# Patient Record
Sex: Female | Born: 1960 | Race: Black or African American | Hispanic: No | Marital: Married | Smoking: Current every day smoker
Health system: Southern US, Community
[De-identification: ages and names within clinical notes are randomized; demographics above are authoritative.]

## PROBLEM LIST (undated history)

## (undated) DIAGNOSIS — I10 Essential (primary) hypertension: Secondary | ICD-10-CM

## (undated) DIAGNOSIS — G629 Polyneuropathy, unspecified: Secondary | ICD-10-CM

## (undated) DIAGNOSIS — E119 Type 2 diabetes mellitus without complications: Secondary | ICD-10-CM

## (undated) DIAGNOSIS — Z803 Family history of malignant neoplasm of breast: Secondary | ICD-10-CM

## (undated) DIAGNOSIS — R7989 Other specified abnormal findings of blood chemistry: Secondary | ICD-10-CM

## (undated) HISTORY — DX: Family history of malignant neoplasm of breast: Z80.3

## (undated) HISTORY — DX: Type 2 diabetes mellitus without complications: E11.9

## (undated) HISTORY — DX: Other specified abnormal findings of blood chemistry: R79.89

## (undated) HISTORY — DX: Essential (primary) hypertension: I10

## (undated) HISTORY — DX: Polyneuropathy, unspecified: G62.9

---

## 2008-12-01 ENCOUNTER — Inpatient Hospital Stay: Payer: Self-pay | Admitting: Internal Medicine

## 2010-07-05 IMAGING — CT CT HEAD WITHOUT CONTRAST
2 series · 16 of 30 positions shown, 20 images · non-contrast
Comparison: none

REASON FOR EXAM: altered mental status
COMMENTS:

PROCEDURE:     CT  - CT HEAD WITHOUT CONTRAST  - November 30, 2008 [DATE]
RESULT:
HISTORY: Altered mental status.
COMPARISON STUDIES: No recent.
PROCEDURE AND FINDINGS: No intra-axial or extra-axial pathologic fluid or
blood collections identified.  No mass lesion is noted. There is no
hydrocephalus.

[Series 3: without · axial · non-contrast · 0.45mm/px · z∈[+759,+889]mm · 13 of 40 slices shown, 17 images]
[im 3/40  brain]
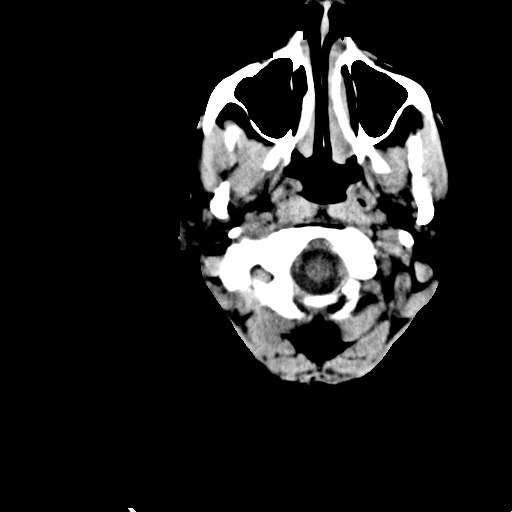
[im 3/40  bone]
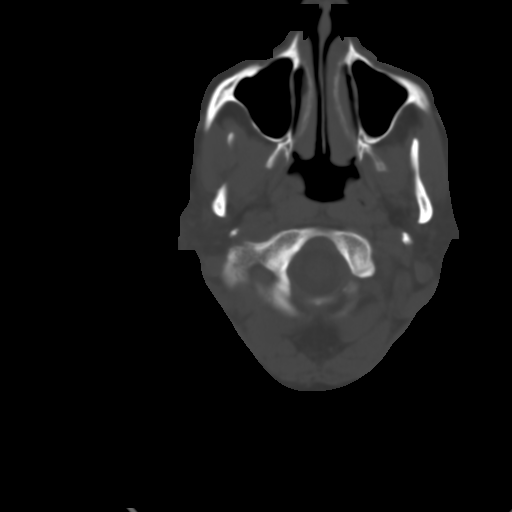
[im 6/40  brain]
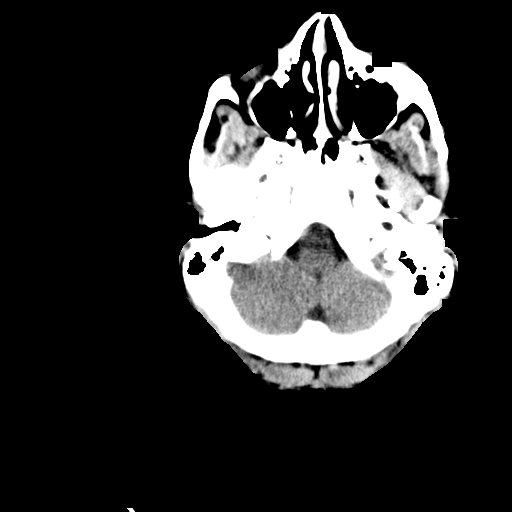
[im 9/40  brain]
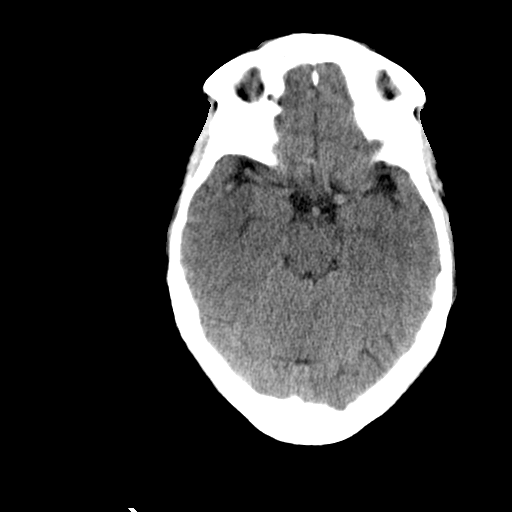
[im 12/40  brain]
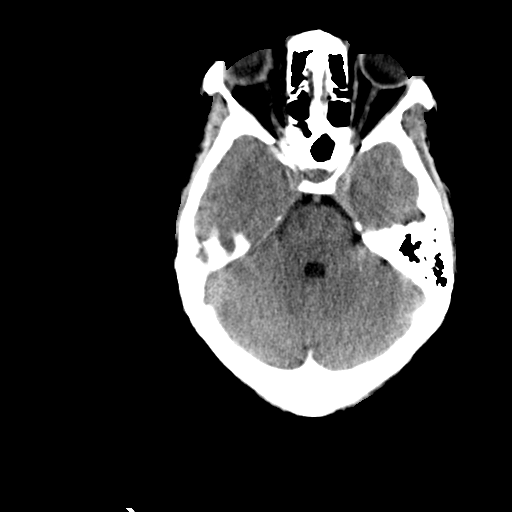
[im 14/40  brain]
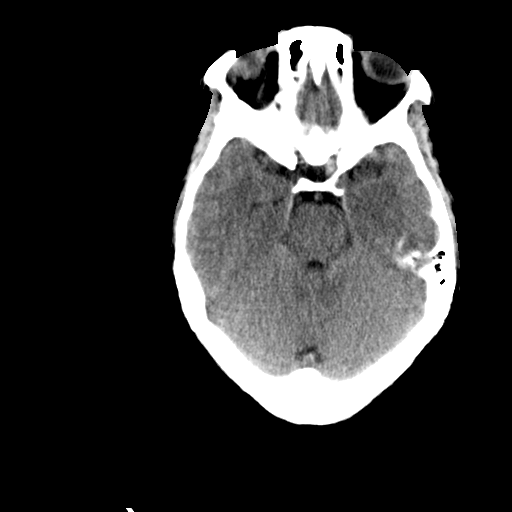
[im 14/40  bone]
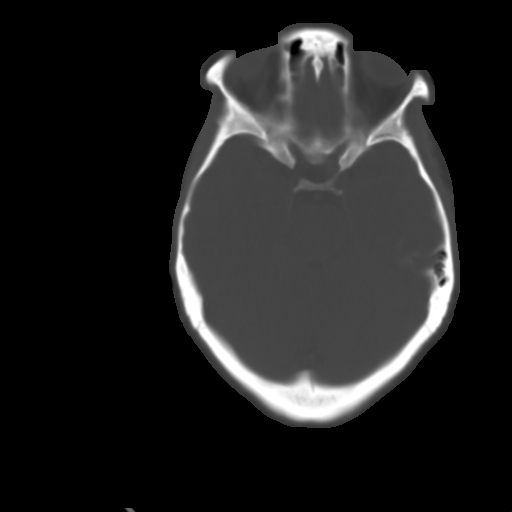
[im 17/40  brain]
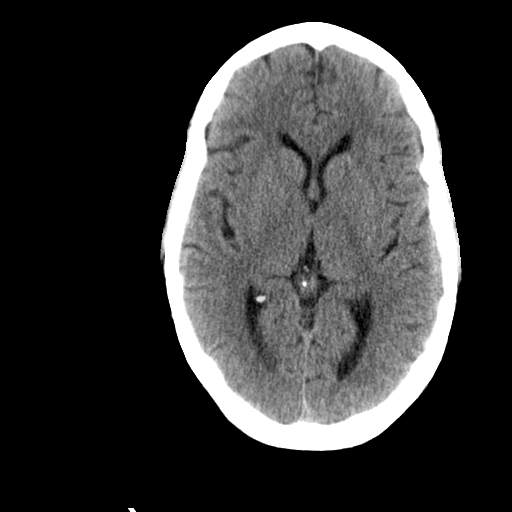
[im 20/40  brain]
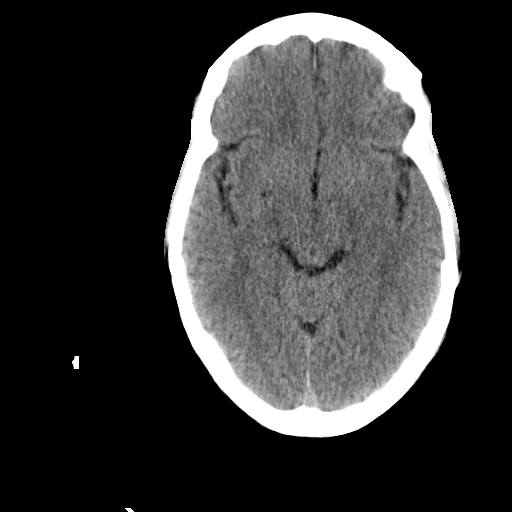
[im 23/40  brain]
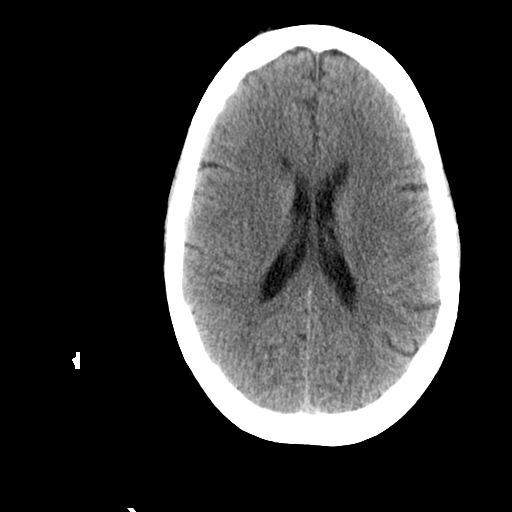
[im 26/40  brain]
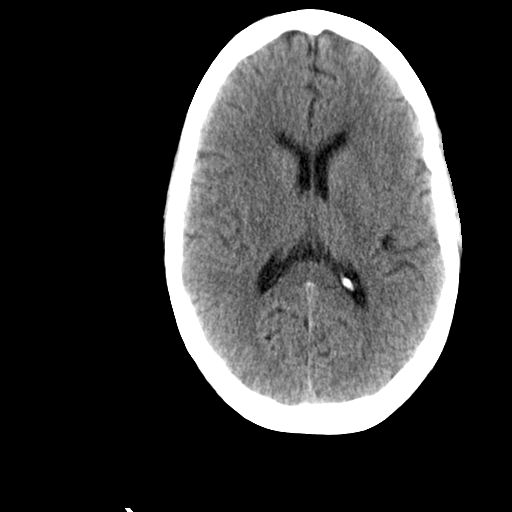
[im 26/40  bone]
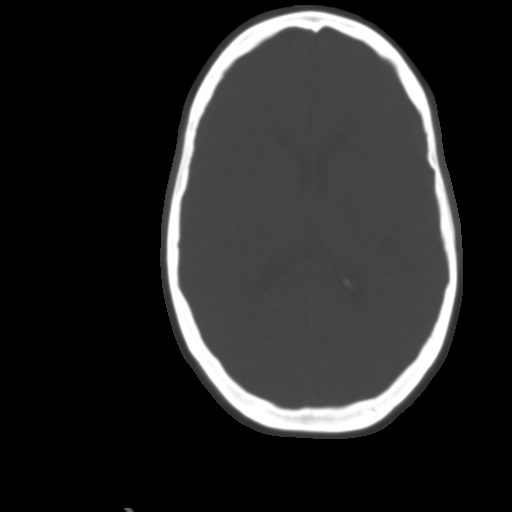
[im 28/40  brain]
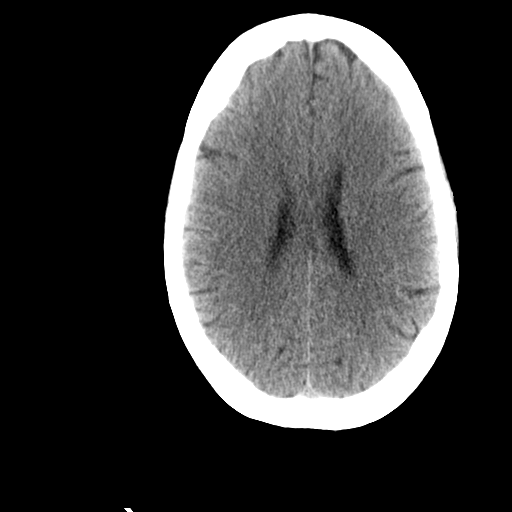
[im 31/40  brain]
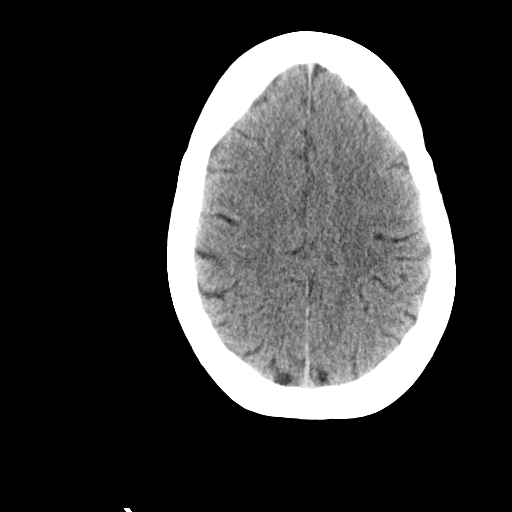
[im 34/40  brain]
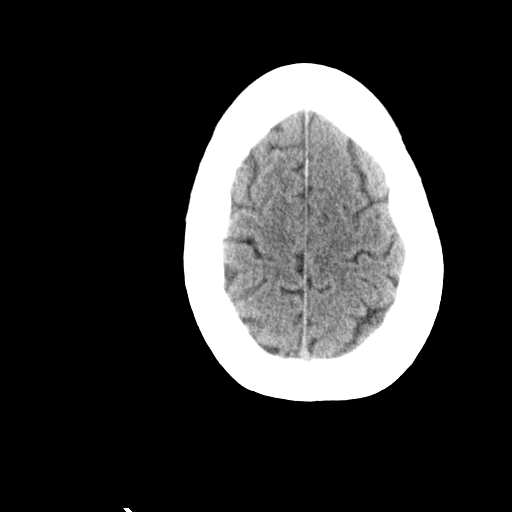
[im 37/40  brain]
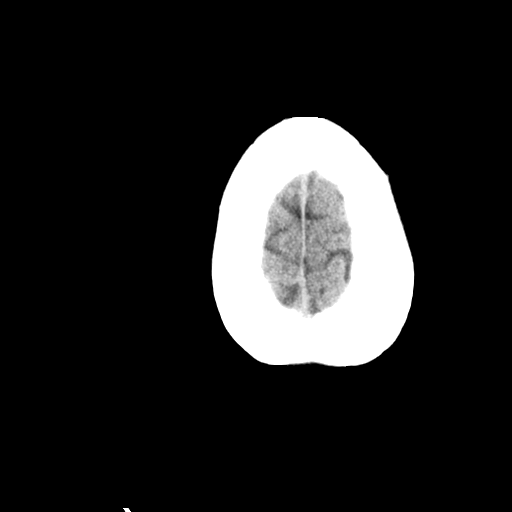
[im 37/40  bone]
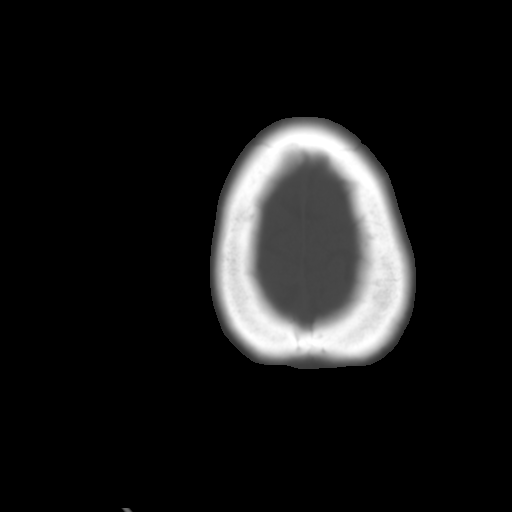

[Series 4: bone · axial · 0.45mm/px · z∈[+759,+799]mm · 3 of 40 slices shown]
[im 3/40  bone]
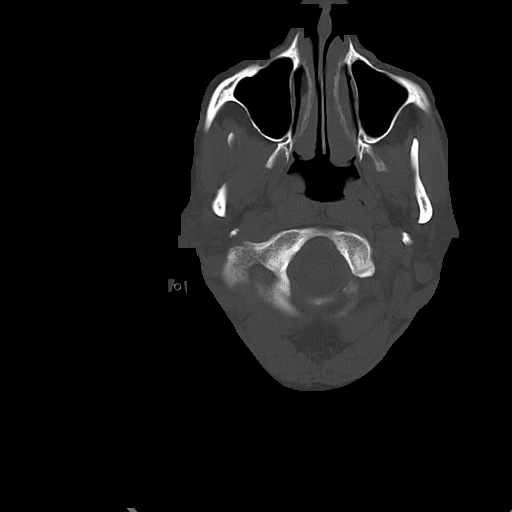
[im 9/40  bone]
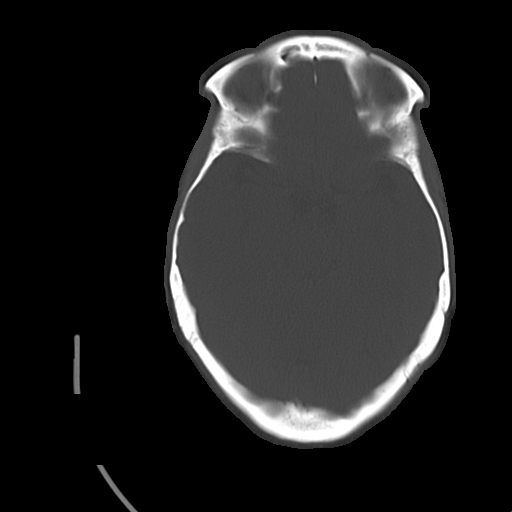
[im 14/40  bone]
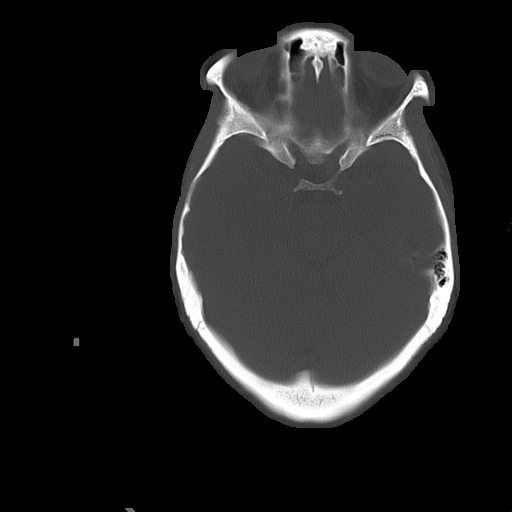

[16 of 30 positions shown; findings below may reference images not displayed]

IMPRESSION: 1. No acute abnormality.

## 2017-09-14 ENCOUNTER — Encounter: Payer: Self-pay | Admitting: Neurology

## 2017-12-13 ENCOUNTER — Ambulatory Visit (INDEPENDENT_AMBULATORY_CARE_PROVIDER_SITE_OTHER): Payer: Self-pay | Admitting: Neurology

## 2017-12-13 ENCOUNTER — Encounter: Payer: Self-pay | Admitting: Neurology

## 2017-12-13 VITALS — BP 134/84 | HR 79 | Ht 64.0 in | Wt 206.2 lb

## 2017-12-13 DIAGNOSIS — Z720 Tobacco use: Secondary | ICD-10-CM

## 2017-12-13 DIAGNOSIS — M797 Fibromyalgia: Secondary | ICD-10-CM

## 2017-12-13 DIAGNOSIS — M79604 Pain in right leg: Secondary | ICD-10-CM

## 2017-12-13 DIAGNOSIS — M79605 Pain in left leg: Secondary | ICD-10-CM

## 2017-12-13 DIAGNOSIS — E1142 Type 2 diabetes mellitus with diabetic polyneuropathy: Secondary | ICD-10-CM

## 2017-12-13 NOTE — Progress Notes (Signed)
[facility] Neurology Division Clinic Note - Initial Visit   Date: 12/13/17  Rosaisela Jamroz MRN: [id number] DOB: 11-11-1961   Dear Dr. Marlan Palau:  Thank you for your kind referral of Shawntia Mangal for consultation of bilateral leg pain. Although her history is well known to you, please allow [place] to reiterate it for the purpose of our medical record. The patient was accompanied to the clinic by self.    History of Present Illness: Marlen Koman is a 56 y.o. right-handed African American female with insulin-dependent diabetes mellitus, PTSD, fibromyalgia, hypertension, hyperlipidemia, alcohol abuse, tobacco use presenting for evaluation of bilateral leg pain.    Since 1991, she complains of achy pain of the lower legs. Pain is constant and tender to touch.  She also complains of numbness over the soles. She does not have burning or tingling sensation of the legs.  She takes gabapentin 900mg  three times daily, which eases her pain.  Pain is exacerbated by walking.  Elevating her legs can help.  She complains of imbalance and trips often.  She falls about once per month and uses a walker, as needed. She has been diabetic for the past several years.She smokes about 0.5 - 1 pack daily since the age of 56 years.  She was drinking heavily about 5 years and quit in 2017.    She does not work and is on disability for PTSD and chronic pain.    Past Medical History:  Diagnosis Date  . DM (diabetes mellitus) (HCC)   . HTN (hypertension)   . Low vitamin D level   . Polyneuropathy     History reviewed. No pertinent surgical history.   Medications:  Outpatient Encounter Medications as of 12/13/2017  Medication Sig  . amLODipine (NORVASC) 10 MG tablet Take 10 mg by mouth daily.  Marland Kitchen aspirin 325 MG EC tablet Take 325 mg by mouth daily.  Marland Kitchen atorvastatin (LIPITOR) 80 MG tablet Take 80 mg by mouth daily.  . carboxymethylcellulose (REFRESH PLUS) 0.5 % SOLN 1 drop 3 (three) times daily as needed.  .  fluticasone (VERAMYST) 27.5 MCG/SPRAY nasal spray Place 2 sprays into the nose daily.  Marland Kitchen gabapentin (NEURONTIN) 300 MG capsule Take 900 mg by mouth 3 (three) times daily.  Marland Kitchen HYDROcodone-acetaminophen (NORCO/VICODIN) 5-325 MG tablet Take 1 tablet by mouth every 6 (six) hours as needed for moderate pain.  Marland Kitchen ibuprofen (ADVIL,MOTRIN) 800 MG tablet Take 800 mg by mouth every 6 (six) hours as needed.  . insulin glargine (LANTUS) 100 UNIT/ML injection Inject into the skin at bedtime.  Marland Kitchen lisinopril (PRINIVIL,ZESTRIL) 10 MG tablet Take 10 mg by mouth daily.  Marland Kitchen lisinopril (PRINIVIL,ZESTRIL) 20 MG tablet Take 20 mg by mouth daily.  Marland Kitchen loratadine (CLARITIN) 10 MG tablet Take 10 mg by mouth daily.  . meclizine (ANTIVERT) 25 MG tablet Take 25 mg by mouth 3 (three) times daily as needed for dizziness.  . metFORMIN (GLUCOPHAGE) 500 MG tablet Take by mouth 2 (two) times daily with a meal.  . Multiple Vitamin (MULTIVITAMIN) tablet Take 1 tablet by mouth daily.  . potassium chloride (K-DUR) 10 MEQ tablet Take 20 mEq by mouth daily.  . prazosin (MINIPRESS) 1 MG capsule Take 1 mg by mouth at bedtime.  . risperiDONE (RISPERDAL) 1 MG tablet Take 1 mg by mouth at bedtime.  . sertraline (ZOLOFT) 100 MG tablet Take 100 mg by mouth daily.  Marland Kitchen thiamine 100 MG tablet Take 100 mg by mouth daily.  . traMADol (ULTRAM) 50 MG tablet Take  by mouth every 6 (six) hours as needed.  . traZODone (DESYREL) 50 MG tablet Take 50 mg by mouth at bedtime.  . Vitamin D, Ergocalciferol, 2000 units CAPS Take by mouth 2 (two) times daily.   No facility-administered encounter medications on file as of 12/13/2017.      Allergies:  Allergies  Allergen Reactions  . Sulfa Antibiotics     Family History: Father died at the age of 56 from cancer. Mother is living and has OA, breast cancer. Her siblings are healthy. No family history of neuropathy.  Social History: Social History   Tobacco Use  . Smoking status: Current Every Day  Smoker  . Smokeless tobacco: Never Used  Substance Use Topics  . Alcohol use: Yes  . Drug use: No   Social History   Social History Narrative   Lives with husband in a one story home.  No children.  [employer].  Education: 13 years.     Review of Systems:  CONSTITUTIONAL: No fevers, chills, night sweats, or weight loss.   EYES: No visual changes or eye pain ENT: No hearing changes.  No history of nose bleeds.   RESPIRATORY: No cough, wheezing and shortness of breath.   CARDIOVASCULAR: Negative for chest pain, and palpitations.   GI: Negative for abdominal discomfort, blood in stools or black stools.  No recent change in bowel habits.   GU:  No history of incontinence.   MUSCLOSKELETAL: +history of joint pain or swelling.  No myalgias.   SKIN: Negative for lesions, rash, and itching.   HEMATOLOGY/ONCOLOGY: Negative for prolonged bleeding, bruising easily, and swollen nodes.  No history of cancer.   ENDOCRINE: Negative for cold or heat intolerance, polydipsia or goiter.   PSYCH:  +depression or anxiety symptoms.   NEURO: As Above.   Vital Signs:  BP 134/84   Pulse 79   Ht 5\' 4"  (1.626 m)   Wt 206 lb 4 oz (93.6 kg)   SpO2 97%   BMI 35.40 kg/m    General Medical Exam:   General:  Well appearing, comfortable.   Eyes/ENT: see cranial nerve examination.   Neck: No masses appreciated.  Full range of motion without tenderness.  No carotid bruits. Respiratory:  Clear to auscultation, good air entry bilaterally.   Cardiac:  Regular rate and rhythm, no murmur.   Extremities:  No deformities, edema, or skin discoloration.  Skin:  No rashes or lesions.  Neurological Exam: MENTAL STATUS including orientation to time, place, person, recent and remote memory, attention span and concentration, language, and fund of knowledge is normal.  Speech is not dysarthric.  CRANIAL NERVES: II:  No visual field defects.  Unremarkable fundi.   III-IV-VI: Pupils equal round and reactive to  light.  Normal conjugate, extra-ocular eye movements in all directions of gaze.  No nystagmus.  No ptosis.   V:  Normal facial sensation.    VII:  Normal facial symmetry and movements.  VIII:  Normal hearing and vestibular function.   IX-X:  Normal palatal movement.   XI:  Normal shoulder shrug and head rotation.   XII:  Normal tongue strength and range of motion, no deviation or fasciculation.  MOTOR: There is significant muscle and skin tenderness even with light pressure, especially over the legs. She is positive for many fibromyalgia tender points.  No atrophy, fasciculations or abnormal movements.  No pronator drift.  Tone is normal.    Right Upper Extremity:    Left Upper Extremity:  Deltoid  5/5   Deltoid  5/5   Biceps  5/5   Biceps  5/5   Triceps  5/5   Triceps  5/5   Wrist extensors  5/5   Wrist extensors  5/5   Wrist flexors  5/5   Wrist flexors  5/5   Finger extensors  5/5   Finger extensors  5/5   Finger flexors  5/5   Finger flexors  5/5   Dorsal interossei  5/5   Dorsal interossei  5/5   Abductor pollicis  5/5   Abductor pollicis  5/5   Tone (Ashworth scale)  0  Tone (Ashworth scale)  0   Right Lower Extremity:    Left Lower Extremity:    Hip flexors  5/5   Hip flexors  5/5   Hip extensors  5/5   Hip extensors  5/5   Knee flexors  5/5   Knee flexors  5/5   Knee extensors  5/5   Knee extensors  5/5   Dorsiflexors  5/5   Dorsiflexors  5/5   Plantarflexors  5/5   Plantarflexors  5/5   Toe extensors  5/5   Toe extensors  5/5   Toe flexors  5/5   Toe flexors  5/5   Tone (Ashworth scale)  0  Tone (Ashworth scale)  0   MSRs:  Right                                                                 Left brachioradialis 2+  brachioradialis 2+  biceps 2+  biceps 2+  triceps 2+  triceps 2+  patellar 2+  patellar 2+  ankle jerk 1+  ankle jerk 1+  Hoffman no  Hoffman no  plantar response down  plantar response down   SENSORY:   Normal and symmetric perception of light  touch, pinprick, vibration, and proprioception.  Romberg's sign absent.   COORDINATION/GAIT: Normal finger-to- nose-finger and heel-to-shin.  Intact rapid alternating movements bilaterally.  Gait appears mildly wide-based, antalgic, and slow.  Tandem and stressed gait intact.    IMPRESSION: Mrs. Leonides SchanzDorsey is a 56 year-old female referred for evaluation of bilateral leg pain.   Her exam is shows distal hyporeflexia and otherwise intact sensation and strength.  Although she may have a distal and symmetric diabetic neuropathy causing numbness in the feet, but her primary complaint is achy pain in the legs, which is very tender to palpation; this is not a feature of neuropathy and is more suggestive of pain related to her fibromyalgia.  I will schedule her for NCS/EMG of the legs.  She has a strong tobacco history and complains of exertional leg pain, which is concerning for claudication.  Vascular studies of the legs will also be ordered.  Patient was informed that my office does not manage fibromyalgia, so she will need to see her PCP for medication management for this - she is currently taking gabapentin 900mg  TID.   Patient educated on tobacco cessation.  She currently smoking 0.5-1 packs/day.  Patient was informed of the dangers of tobacco abuse including stroke, cancer, and MI, as well as benefits of tobacco cessation.  She will consider quitting, but does not seem truly interested.  Approximately 5 mins were spent counseling patient cessation  techniques. We discussed various methods to help quit smoking, including deciding on a date to quit, joining a support group, pharmacological agents- nicotine gum/patch/lozenges, chantix.  I will reassess her progress at the next follow-up visit.    Further recommendations based on her results.   Thank you for allowing me to participate in patient's care.  If I can answer any additional questions, I would be pleased to do so.    Sincerely,    Albertha Beattie K. Allena Katz,  DO

## 2017-12-13 NOTE — Patient Instructions (Addendum)
1.  NCS/EMG of the legs 2.  Arterial studies of the legs 3.  Please try to cut back, if not, stop smoking

## 2018-01-18 ENCOUNTER — Ambulatory Visit (INDEPENDENT_AMBULATORY_CARE_PROVIDER_SITE_OTHER): Payer: No Typology Code available for payment source | Admitting: Neurology

## 2018-01-18 DIAGNOSIS — M79604 Pain in right leg: Secondary | ICD-10-CM

## 2018-01-18 DIAGNOSIS — M79605 Pain in left leg: Secondary | ICD-10-CM

## 2018-01-18 DIAGNOSIS — E1142 Type 2 diabetes mellitus with diabetic polyneuropathy: Secondary | ICD-10-CM

## 2018-01-18 NOTE — Procedures (Signed)
[facility]  [address] [place], [address]  [place], [place] [address] Tel: [phone] Fax:  [phone] Test Date:  01/18/2018  Patient: Kylie Holt DOB: [birth year] Physician: Nita Sickle, DO  Sex: Female Height: 5\' 4"  Ref Phys: Nita Sickle, DO  ID#: [id number] Temp: 32.7C Technician:    Patient Complaints: This is a 57 year-old female with fibromyalgia referred for evaluation of bilateral leg pain.  NCV & EMG Findings: Extensive electrodiagnostic testing of the left lower extremity and additional studies of the right shows:  1. Bilateral sural and superficial peroneal sensory responses are within normal limits. 2. Bilateral peroneal and tibial motor responses are within normal limits. 3. Left tibial H reflex study is within normal limits. 4. There is no evidence of active or chronic motor axon loss changes affecting any of the tested muscles. Motor unit configuration and recruitment pattern is within normal limits.  Impression: This is a normal study of the lower extremities.   In particular, there is no evidence of a sensorimotor polyneuropathy, lumbosacral radiculopathy, or diffuse myopathy.   ___________________________ Nita Sickle, DO    Nerve Conduction Studies Anti Sensory Summary Table   Stim Site NR Peak (ms) Norm Peak (ms) P-T Amp (V) Norm P-T Amp  Left Sup Peroneal Anti Sensory (Ant Lat Mall)  32.7C  12 cm    2.5 <4.6 12.2 >4  Right Sup Peroneal Anti Sensory (Ant Lat Mall)  32.7C  12 cm    2.8 <4.6 13.2 >4  Left Sural Anti Sensory (Lat Mall)  32.7C  Calf    2.9 <4.6 15.2 >4  Right Sural Anti Sensory (Lat Mall)  32.7C  Calf    2.3 <4.6 18.5 >4   Motor Summary Table   Stim Site NR Onset (ms) Norm Onset (ms) O-P Amp (mV) Norm O-P Amp Site1 Site2 Delta-0 (ms) Dist (cm) Vel (m/s) Norm Vel (m/s)  Left Peroneal Motor (Ext Dig Brev)  32.7C  Ankle    3.5 <6.0 7.1 >2.5 B Fib Ankle 7.3 35.0 48 >40  B Fib    10.8  6.7  Poplt B Fib 1.1 9.0 82 >40    Poplt    11.9  6.1         Right Peroneal Motor (Ext Dig Brev)  32.7C  Ankle    3.2 <6.0 8.4 >2.5 B Fib Ankle 7.8 36.0 46 >40  B Fib    11.0  8.4  Poplt B Fib 1.1 9.0 82 >40  Poplt    12.1  8.2         Left Tibial Motor (Abd Hall Brev)  32.7C  Ankle    3.6 <6.0 6.3 >4 Knee Ankle 8.0 39.0 49 >40  Knee    11.6  4.3         Right Tibial Motor (Abd Hall Brev)  32.7C  Ankle    3.5 <6.0 7.5 >4 Knee Ankle 8.2 38.0 46 >40  Knee    11.7  4.8          H Reflex Studies   NR H-Lat (ms) Lat Norm (ms) L-R H-Lat (ms)  Left Tibial (Gastroc)  32.7C     30.34 <35    EMG   Side Muscle Ins Act Fibs Psw Fasc Number Recrt Dur Dur. Amp Amp. Poly Poly. Comment  Left AntTibialis Nml Nml Nml Nml Nml Nml Nml Nml Nml Nml Nml Nml N/A  Left Gastroc Nml Nml Nml Nml Nml Nml Nml Nml Nml Nml Nml Nml N/A  Left RectFemoris Nml Nml Nml Nml Nml Nml Nml Nml Nml Nml Nml Nml N/A  Left GluteusMed Nml Nml Nml Nml Nml Nml Nml Nml Nml Nml Nml Nml N/A  Left BicepsFemS Nml Nml Nml Nml Nml Nml Nml Nml Nml Nml Nml Nml N/A  Right AntTibialis Nml Nml Nml Nml Nml Nml Nml Nml Nml Nml Nml Nml N/A  Right Gastroc Nml Nml Nml Nml Nml Nml Nml Nml Nml Nml Nml Nml N/A  Right RectFemoris Nml Nml Nml Nml Nml Nml Nml Nml Nml Nml Nml Nml N/A      Waveforms:

## 2018-01-19 ENCOUNTER — Telehealth: Payer: Self-pay | Admitting: *Deleted

## 2018-01-19 NOTE — Telephone Encounter (Signed)
-----   Message from [place] Chardonika K Patel, DO sent at 01/18/2018  5:20 PM EST ----- Please inform pt nerve testing is normal - no evidence of neuropathy or muscle disease.  Her leg pain is most likely due to underlying fibromyalgia and I recommend that she f/u with her PCP.  Thanks.

## 2018-01-19 NOTE — Telephone Encounter (Signed)
Patient given results and instructions.   

## 2018-02-09 ENCOUNTER — Ambulatory Visit: Payer: No Typology Code available for payment source | Admitting: Cardiovascular Disease

## 2019-02-15 ENCOUNTER — Telehealth: Payer: Self-pay | Admitting: Genetic Counselor

## 2019-02-15 ENCOUNTER — Encounter: Payer: Self-pay | Admitting: Genetic Counselor

## 2019-02-15 NOTE — Telephone Encounter (Signed)
Received a genetic counseling appt from the [facility] for genetic counseling. A genetic counseling appt has been scheduled for the pt to see Julian Hy on 3/12 at 2pm. Letter mailed to the pt.

## 2019-03-02 ENCOUNTER — Inpatient Hospital Stay: Payer: Self-pay

## 2019-03-03 ENCOUNTER — Encounter: Payer: Self-pay | Admitting: Genetic Counselor

## 2019-04-04 ENCOUNTER — Telehealth: Payer: Self-pay | Admitting: Licensed Clinical Social Worker

## 2019-04-04 NOTE — Telephone Encounter (Signed)
Pt cld to schedule a genetic counseling appt for 6/22 at 1pm. Pt aware to arrive 15 minutes early.

## 2019-06-09 ENCOUNTER — Telehealth: Payer: Self-pay | Admitting: Licensed Clinical Social Worker

## 2019-06-09 NOTE — Telephone Encounter (Signed)
Called patient regarding upcoming Webex appointment, left a voicemail and due to no communication to set up Webex this will be a walk-in visit. °

## 2019-06-12 ENCOUNTER — Inpatient Hospital Stay: Payer: Self-pay

## 2019-06-12 ENCOUNTER — Other Ambulatory Visit: Payer: Self-pay

## 2019-06-12 ENCOUNTER — Inpatient Hospital Stay: Payer: Self-pay | Attending: Genetic Counselor | Admitting: Licensed Clinical Social Worker

## 2019-06-12 ENCOUNTER — Encounter: Payer: Self-pay | Admitting: Licensed Clinical Social Worker

## 2019-06-12 DIAGNOSIS — Z803 Family history of malignant neoplasm of breast: Secondary | ICD-10-CM

## 2019-06-12 DIAGNOSIS — Z1379 Encounter for other screening for genetic and chromosomal anomalies: Secondary | ICD-10-CM

## 2019-06-12 NOTE — Progress Notes (Signed)
REFERRING PROVIDER: G[facility] [address] [place],  [place] [address]  PRIMARY PROVIDER:  Patient, No Pcp Per  PRIMARY REASON FOR VISIT:  1. Family history of breast cancer      HISTORY OF PRESENT ILLNESS:   Kylie Holt, a 58 y.o. female, was seen for a [place] cancer genetics consultation due to a family history of cancer.  Kylie Holt presents to clinic today to discuss the possibility of a hereditary predisposition to cancer, genetic testing, and to further clarify her future cancer risks, as well as potential cancer risks for family members.    Kylie Holt is a 58 y.o. female with no personal history of cancer.    CANCER HISTORY:  Oncology History   No history exists.    RISK FACTORS:  Menarche was at age 29.  First live birth at age no children.  OCP use for approximately 0 years.  Ovaries intact: yes.  Hysterectomy: no.  Menopausal status: postmenopausal.  HRT use: 0 years. Colonoscopy: yes; normal. Mammogram within the last year: yes. Number of breast biopsies: 0.  Past Medical History:  Diagnosis Date  . DM (diabetes mellitus) ([place])   . Family history of breast cancer   . HTN (hypertension)   . Low vitamin D level   . Polyneuropathy     No past surgical history on file.  Social History   Socioeconomic History  . Marital status: Married    Spouse name: Not on file  . Number of children: 0  . Years of education: 56  . Highest education level: Not on file  Occupational History  . Not on file  Social Needs  . Financial resource strain: Not on file  . Food insecurity    Worry: Not on file    Inability: Not on file  . Transportation needs    Medical: Not on file    Non-medical: Not on file  Tobacco Use  . Smoking status: Current Every Day Smoker  . Smokeless tobacco: Never Used  Substance and Sexual Activity  . Alcohol use: Yes  . Drug use: No  . Sexual activity: Not on file  Lifestyle  . Physical activity    Days per week: Not  on file    Minutes per session: Not on file  . Stress: Not on file  Relationships  . Social Herbalist on phone: Not on file    Gets together: Not on file    Attends religious service: Not on file    Active member of club or organization: Not on file    Attends meetings of clubs or organizations: Not on file    Relationship status: Not on file  Other Topics Concern  . Not on file  Social History Narrative   Lives with husband in a one story home.  No children.  [employer].  Education: 13 years.      FAMILY HISTORY:  We obtained a detailed, 4-generation family history.  Significant diagnoses are listed below: Family History  Problem Relation Age of Onset  . Breast cancer Mother        dx 51s  . Breast cancer Paternal Aunt   . Breast cancer Paternal Grandmother   . Breast cancer Cousin    Kylie Holt does not have children. She has one full sister and one full brother, no cancers. She has 3 paternal half brothers, 1 paternal half sister, no cancers. She has one maternal half brother, no cancers.   Ms.  Holt's mother was diagnosed with breast cancer in her 39s and is living at 57. The patient is unsure of how many maternal aunts and uncles she has. She knows that one of her maternal aunts is still living and was found to have brain tumors. No cancers in her cousins she is aware of, but she does have limited information about this side. She does not have info about her maternal grandparents.   Kylie Holt's father died in his 63s. The patient had 4 paternal aunts, 1 paternal uncle. One of her paternal aunts had breast cancer diagnosed in her 13s and is in her 86s. This aunt had a daughter who was also diagnosed with breast cancer. No other known cancers in paternal cousins. Her paternal grandmother had breast cancer, unknown age of diagnosis but died at 4. No info about paternal grandfather.   Kylie Holt is unaware of previous family history of genetic testing for hereditary  cancer risks.There is no reported Ashkenazi Jewish ancestry. There is no known consanguinity.  GENETIC COUNSELING ASSESSMENT: Kylie Holt is a 58 y.o. female with a family history which is somewhat suggestive of a hereditary cancer syndrome and predisposition to cancer. We, therefore, discussed and recommended the following at today's visit.   DISCUSSION: We discussed that 5 - 10% of breast cancer is hereditary, with most cases associated with the BRCA1/BRCA2 genes.  There are other genes that can be associated with hereditary breast cancer syndromes.  These include PALB2, CHEK2, ATM.    We reviewed the characteristics, features and inheritance patterns of hereditary cancer syndromes. We also discussed genetic testing, including the appropriate family members to test, the process of testing, insurance coverage and turn-around-time for results. We discussed the implications of a negative, positive and/or variant of uncertain significant result. We recommended Kylie Holt pursue genetic testing for the Common Hereditary Cancers gene panel.   The Common Hereditary Cancers Panel offered by [facility] includes sequencing and/or deletion duplication testing of the following 48 genes: APC, ATM, AXIN2, BARD1, BMPR1A, BRCA1, BRCA2, BRIP1, CDH1, CDKN2A (p14ARF), CDKN2A (p16INK4a), CKD4, CHEK2, CTNNA1, DICER1, EPCAM (Deletion/duplication testing only), GREM1 (promoter region deletion/duplication testing only), KIT, MEN1, MLH1, MSH2, MSH3, MSH6, MUTYH, NBN, NF1, NHTL1, PALB2, PDGFRA, PMS2, POLD1, POLE, PTEN, RAD50, RAD51C, RAD51D, RNF43, SDHB, SDHC, SDHD, SMAD4, SMARCA4. STK11, TP53, TSC1, TSC2, and VHL.  The following genes were evaluated for sequence changes only: SDHA and HOXB13 c.251G>A variant only.  Based on Kylie Holt's family history of cancer, she meets medical criteria for genetic testing. Despite that she meets criteria, she may still have an out of pocket cost.   Based on the patient's family history, a  statistical model Air cabin crew) was used to estimate her risk of developing breast cancer. This estimates her lifetime risk of developing breast cancer to be approximately 23.4%. This estimation is in the setting of negative test results and may change based on results.The patient's lifetime breast cancer risk is a preliminary estimate based on available information using one of several models endorsed by the [place] (ACS). The ACS recommends consideration of breast MRI screening as an adjunct to mammography for patients at high risk (defined as 20% or greater lifetime risk).     Kylie Holt has been determined to be at high risk for breast cancer.  Therefore, we recommend that annual screening with mammography and breast MRI be performed. We discussed that Kylie Holt should discuss her individual situation with her referring physician and determine a breast cancer screening plan  with which they are both comfortable.    We discussed that some people do not want to undergo genetic testing due to fear of genetic discrimination.  A federal law called the Genetic Information Non-Discrimination Act (GINA) of 2008 helps protect individuals against genetic discrimination based on their genetic test results.  It impacts both health insurance and employment.  For health insurance, it protects against increased premiums, being kicked off insurance or being forced to take a test in order to be insured.  For employment it protects against hiring, firing and promoting decisions based on genetic test results.  Health status due to a cancer diagnosis is not protected under GINA.  This law does not protect life insurance, disability insurance, or other types of insurance.   PLAN: After considering the risks, benefits, and limitations, Kylie Holt provided informed consent to pursue genetic testing. Once [place] approval is obtained, she will have her blood drawn and the blood sample will be sent to [facility] for analysis of the Common Hereditary Cancers Panel. Results should be available within approximately 2-3 weeks' time, at which point they will be disclosed by telephone to Kylie Holt, as will any additional recommendations warranted by these results. Kylie Holt will receive a summary of her genetic counseling visit and a copy of her results once available. This information will also be available in Epic.   Based on Kylie Holt's family history, we recommended her paternal relatives have genetic counseling and testing. Kylie Holt will let us know if we can be of any assistance in coordinating genetic counseling and/or testing for this family member.   Lastly, we encouraged Kylie Holt to remain in contact with cancer genetics annually so that we can continuously update the family history and inform her of any changes in cancer genetics and testing that may be of benefit for this family.   Ms. Popescu questions were answered to her satisfaction today. Our contact information was provided should additional questions or concerns arise. Thank you for the referral and allowing [place] to share in the care of your patient.   Faith Rogue, MS Genetic Counselor [email or web address] Phone: [phone]  The patient was seen for a total of 20 minutes in face-to-face genetic counseling.  Drs. Magrinat, Lindi Adie and/or Burr Medico were available for discussion regarding this case.

## 2019-06-20 ENCOUNTER — Telehealth: Payer: Self-pay | Admitting: Licensed Clinical Social Worker

## 2019-06-30 NOTE — Telephone Encounter (Signed)
Attempted to reach Kylie Holt regarding her genetic testing. We did obtain [facility] authorization. I am waiting to hear back from her whether she'd like to come in and have blood drawn or if she wants me to send kit to her house.

## 2019-07-05 ENCOUNTER — Telehealth: Payer: Self-pay | Admitting: *Deleted

## 2019-07-05 NOTE — Telephone Encounter (Signed)
Records faxed to [facility]. [facility]) [facility] - Release [id number]

## 2019-11-06 ENCOUNTER — Telehealth: Payer: Self-pay | Admitting: Licensed Clinical Social Worker

## 2019-11-06 NOTE — Telephone Encounter (Signed)
Returned patient's phone call regarding rescheduling an appointment, rescheduled missed June appointment to 11/30. Walk-in visit.

## 2019-11-15 ENCOUNTER — Telehealth: Payer: Self-pay | Admitting: Licensed Clinical Social Worker

## 2019-11-15 NOTE — Telephone Encounter (Signed)
Left vm for Kylie Holt that she has already had genetic counseling with me in June, and that she just didn't have genetic testing yet. Let her know we are cancelling the genetic counseling appointment and she can come in to get her blood drawn for genetic testing at 2 pm.

## 2019-11-20 ENCOUNTER — Other Ambulatory Visit: Payer: Self-pay

## 2019-11-20 ENCOUNTER — Encounter: Payer: Self-pay | Admitting: Licensed Clinical Social Worker

## 2019-11-20 ENCOUNTER — Inpatient Hospital Stay: Payer: Non-veteran care | Attending: Genetic Counselor

## 2019-11-28 ENCOUNTER — Telehealth: Payer: Self-pay | Admitting: Licensed Clinical Social Worker

## 2019-11-28 ENCOUNTER — Encounter: Payer: Self-pay | Admitting: Licensed Clinical Social Worker

## 2019-11-28 ENCOUNTER — Ambulatory Visit: Payer: Self-pay | Admitting: Licensed Clinical Social Worker

## 2019-11-28 DIAGNOSIS — Z1379 Encounter for other screening for genetic and chromosomal anomalies: Secondary | ICD-10-CM | POA: Insufficient documentation

## 2019-11-28 DIAGNOSIS — Z803 Family history of malignant neoplasm of breast: Secondary | ICD-10-CM

## 2019-11-28 NOTE — Progress Notes (Signed)
HPI:  [name] was previously seen in the [place] clinic due to a family history of cancer and concerns regarding a hereditary predisposition to cancer. Please refer to our prior cancer genetics clinic note for more information regarding our discussion, assessment and recommendations, at the time. [name] recent genetic test results were disclosed to her, as were recommendations warranted by these results. These results and recommendations are discussed in more detail below.  CANCER HISTORY:  Oncology History   No history exists.    FAMILY HISTORY:  We obtained a detailed, 4-generation family history.  Significant diagnoses are listed below: Family History  Problem Relation Age of Onset   Breast cancer Mother        dx 66s   Breast cancer Paternal 64    Breast cancer Paternal Grandmother    Breast cancer Cousin     [name] does not have children. She has one full sister and one full brother, no cancers. She has 3 paternal half brothers, 1 paternal half sister, no cancers. She has one maternal half brother, no cancers.   [name] mother was diagnosed with breast cancer in her 63s and is living at 1. The patient is unsure of how many maternal aunts and uncles she has. She knows that one of her maternal aunts is still living and was found to have brain tumors. No cancers in her cousins she is aware of, but she does have limited information about this side. She does not have info about her maternal grandparents.   [name]'s father died in his 47s. The patient had 4 paternal aunts, 1 paternal uncle. One of her paternal aunts had breast cancer diagnosed in her 42s and is in her 3s. This aunt had a daughter who was also diagnosed with breast cancer. No other known cancers in paternal cousins. Her paternal grandmother had breast cancer, unknown age of diagnosis but died at 91. No info about paternal grandfather.   [name] is unaware of previous  family history of genetic testing for hereditary cancer risks.There is no reported Ashkenazi Jewish ancestry. There is no known consanguinity.  GENETIC TEST RESULTS: Genetic testing reported out on 11/27/2019 through the [facility] Common Hereditary cancer panel found no pathogenic mutations.   The Common Hereditary Cancers Panel offered by [facility] includes sequencing and/or deletion duplication testing of the following 48 genes: APC, ATM, AXIN2, BARD1, BMPR1A, BRCA1, BRCA2, BRIP1, CDH1, CDKN2A (p14ARF), CDKN2A (p16INK4a), CKD4, CHEK2, CTNNA1, DICER1, EPCAM (Deletion/duplication testing only), GREM1 (promoter region deletion/duplication testing only), KIT, MEN1, MLH1, MSH2, MSH3, MSH6, MUTYH, NBN, NF1, NHTL1, PALB2, PDGFRA, PMS2, POLD1, POLE, PTEN, RAD50, RAD51C, RAD51D, RNF43, SDHB, SDHC, SDHD, SMAD4, SMARCA4. STK11, TP53, TSC1, TSC2, and VHL.  The following genes were evaluated for sequence changes only: SDHA and HOXB13 c.251G>A variant only.  The test report has been scanned into EPIC and is located under the Molecular Pathology section of the Results Review tab.  A portion of the result report is included below for reference.     We discussed with [name] that because current genetic testing is not perfect, it is possible there may be a gene mutation in one of these genes that current testing cannot detect, but that chance is small.  We also discussed, that there could be another gene that has not yet been discovered, or that we have not yet tested, that is responsible for the cancer diagnoses in the family. It is also possible there is a hereditary cause  for the cancer in the family that [name] did not inherit and therefore was not identified in her testing.  Therefore, it is important to remain in touch with cancer genetics in the future so that we can continue to offer [name] the most up to date genetic testing.   ADDITIONAL GENETIC TESTING: We discussed with [name] that her genetic  testing was fairly extensive.  If there are genes identified to increase cancer risk that can be analyzed in the future, we would be happy to discuss and coordinate this testing at that time.    CANCER SCREENING RECOMMENDATIONS: [name] test result is considered negative (normal).  This means that we have not identified a hereditary cause for her family history of cancer at this time.   While reassuring, this does not definitively rule out a hereditary predisposition to cancer. It is still possible that there could be genetic mutations that are undetectable by current technology. There could be genetic mutations in genes that have not been tested or identified to increase cancer risk.  Therefore, it is recommended she continue to follow the cancer management and screening guidelines provided by herprimary healthcare provider.   An individual's cancer risk and medical management are not determined by genetic test results alone. Overall cancer risk assessment incorporates additional factors, including personal medical history, family history, and any available genetic information that may result in a personalized plan for cancer prevention and surveillance.   Based on the patient's family history and genetic testing, a statistical model Air cabin crew) was used to estimate her risk of developing breast cancer. This estimates her lifetime risk of developing breast cancer to be approximately 23.4%.The patient's lifetime breast cancer risk is a preliminary estimate based on available information using one of several models endorsed by the [place] (ACS). The ACS recommends consideration of breast MRI screening as an adjunct to mammography for patients at high risk (defined as 20% or greater lifetime risk).      [name] has been determined to be at high risk for breast cancer.  Therefore, we recommend annual screening with mammography and breast MRI.  We discussed that [name] should  discuss her individual situation with her referring physician and determine a breast cancer screening plan with which they are both comfortable.    RECOMMENDATIONS FOR FAMILY MEMBERS:  Relatives in this family might be at some increased risk of developing cancer, over the general population risk, simply due to the family history of cancer.  We recommended female relatives in this family have a yearly mammogram beginning at age 61, or 24 years younger than the earliest onset of cancer, an annual clinical breast exam, and perform monthly breast self-exams. Female relatives in this family should also have a gynecological exam as recommended by their primary provider. All family members should have a colonoscopy by age 25, or as directed by their physicians.   It is also possible there is a hereditary cause for the cancer in [name]'s family that she did not inherit and therefore was not identified in her.  Based on [name]'s family history, we recommended her paternal relatives have genetic counseling and testing. [name] will let us know if we can be of any assistance in coordinating genetic counseling and/or testing for these family members.  FOLLOW-UP: Lastly, we discussed with [name] that cancer genetics is a rapidly advancing field and it is possible that new genetic tests will be appropriate for her and/or her  family members in the future. We encouraged her to remain in contact with cancer genetics on an annual basis so we can update her personal and family histories and let her know of advances in cancer genetics that may benefit this family.   Our contact number was provided. [name] questions were answered to her satisfaction, and she knows she is welcome to call us at anytime with additional questions or concerns.   Faith Rogue, MS, [facility] Genetic Counselor [email or web address] Phone: [phone]

## 2019-11-28 NOTE — Telephone Encounter (Signed)
Revealed negative genetic testing.  We discussed that we do not know why there is cancer in the family. It could be due to a different gene that we are not testing, or something our current technology cannot pick up.  It will be important for her to keep in contact with genetics to learn if additional testing may be needed in the future.
